# Patient Record
Sex: Female | Born: 2000 | Race: Black or African American | Hispanic: No | Marital: Single | Smoking: Never smoker
Health system: Southern US, Community
[De-identification: ages and names within clinical notes are randomized; demographics above are authoritative.]

## PROBLEM LIST (undated history)

## (undated) DIAGNOSIS — E669 Obesity, unspecified: Secondary | ICD-10-CM

## (undated) DIAGNOSIS — N926 Irregular menstruation, unspecified: Secondary | ICD-10-CM

## (undated) DIAGNOSIS — I10 Essential (primary) hypertension: Secondary | ICD-10-CM

---
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
## 2021-02-28 ENCOUNTER — Ambulatory Visit (INDEPENDENT_AMBULATORY_CARE_PROVIDER_SITE_OTHER): Payer: Medicaid Other | Admitting: Primary Care

## 2022-09-17 IMAGING — CT CT ABD-PELV W/ CM
2 of 5 series · 16 of 46 positions shown, 18 images · IV contrast (APPLIED)
Comparison: None.

CLINICAL DATA: Chest and abdominal pain.

EXAM:
CT ANGIOGRAPHY CHEST
CT ABDOMEN AND PELVIS WITH CONTRAST
TECHNIQUE: Multidetector CT imaging of the chest was performed using the
standard protocol during bolus administration of intravenous
contrast. Multiplanar CT image reconstructions and MIPs were
obtained to evaluate the vascular anatomy. Multidetector CT imaging
of the abdomen and pelvis was performed using the standard protocol
during bolus administration of intravenous contrast.
CONTRAST:  100mL OMNIPAQUE IOHEXOL 350 MG/ML SOLN

[Series 7: abdomen 5.0 · axial · 0.98mm/px · z∈[+724,+1179]mm · 13 of 105 slices shown, 15 images]
[im 7/105  soft-tissue]
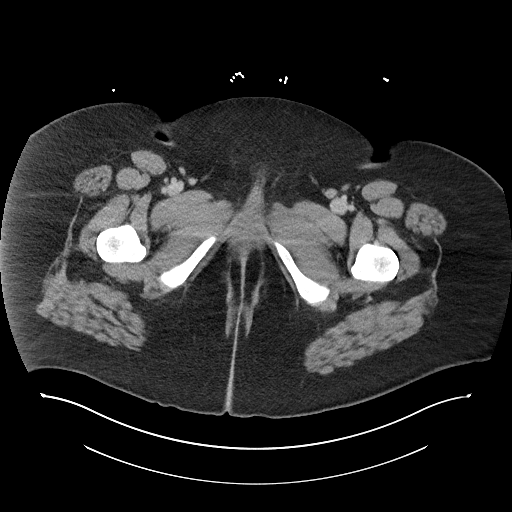
[im 7/105  bone]
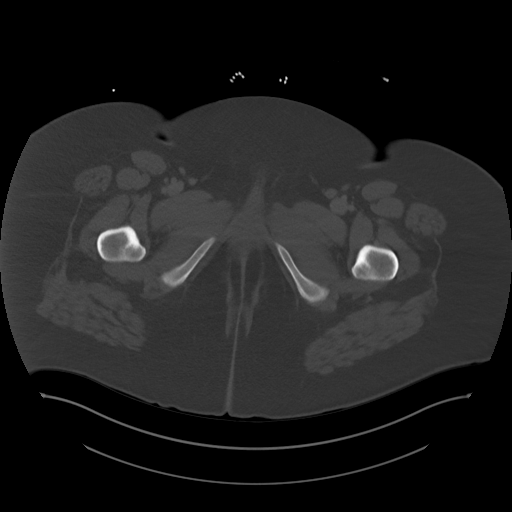
[im 14/105  soft-tissue]
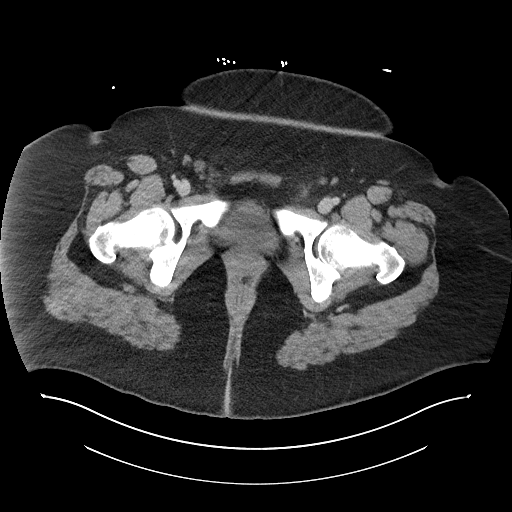
[im 21/105  soft-tissue]
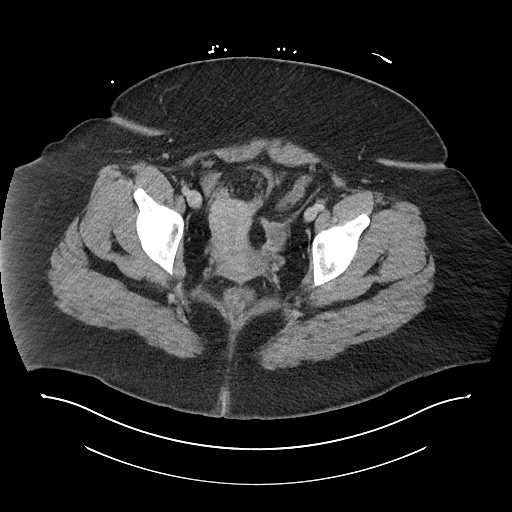
[im 28/105  soft-tissue]
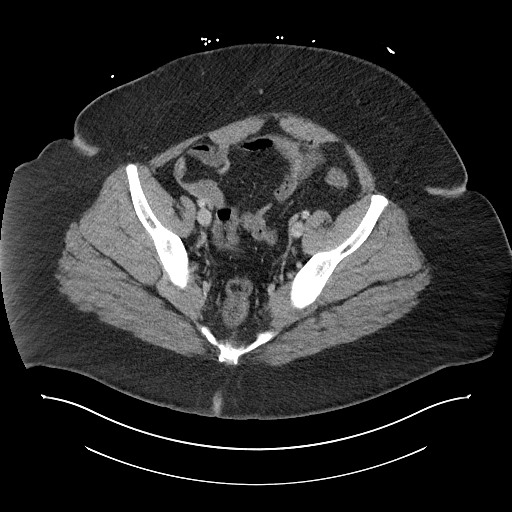
[im 35/105  soft-tissue]
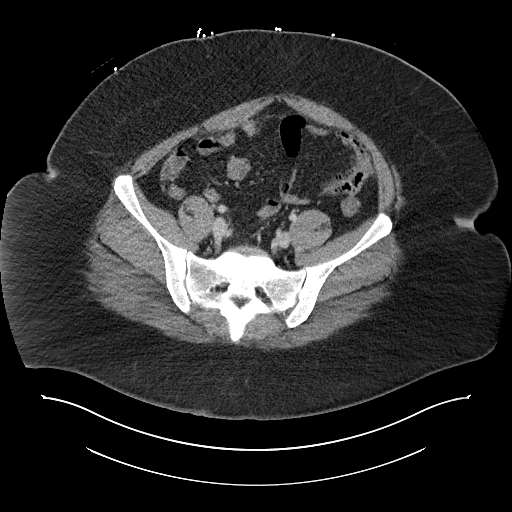
[im 42/105  soft-tissue]
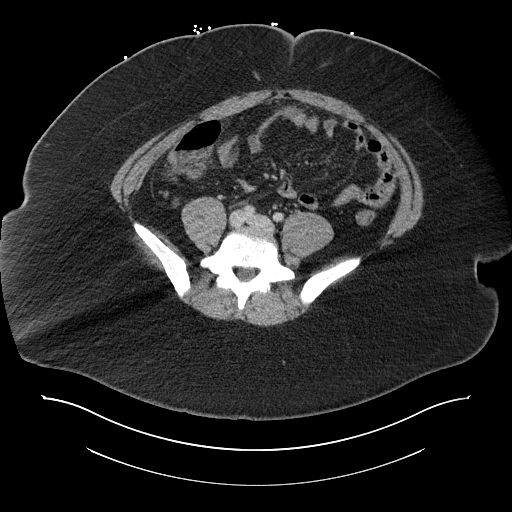
[im 56/105  soft-tissue]
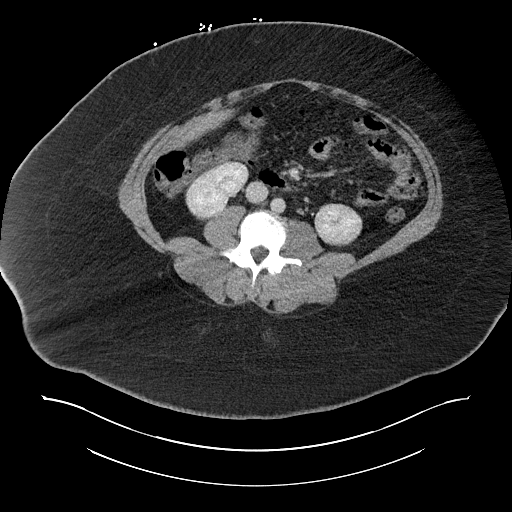
[im 63/105  soft-tissue]
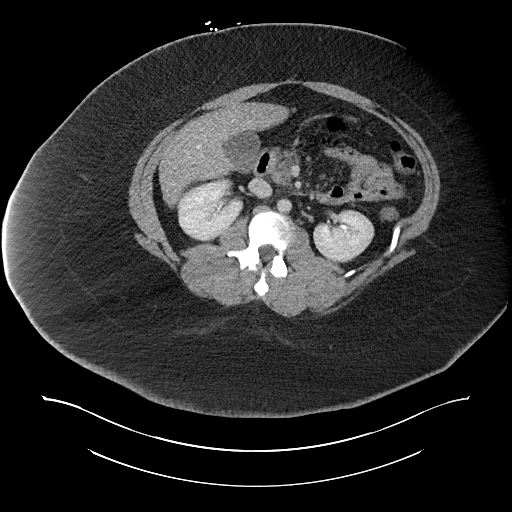
[im 70/105  soft-tissue]
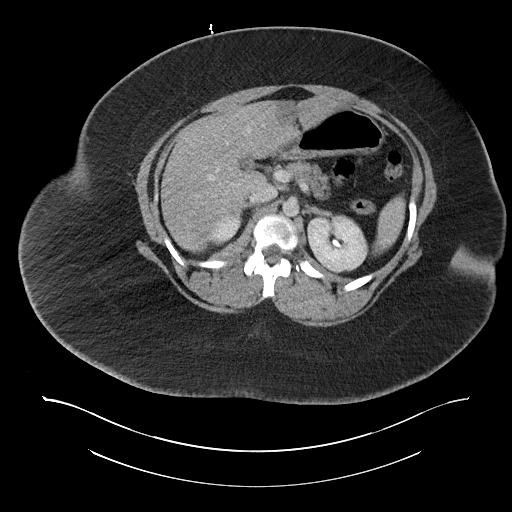
[im 70/105  bone]
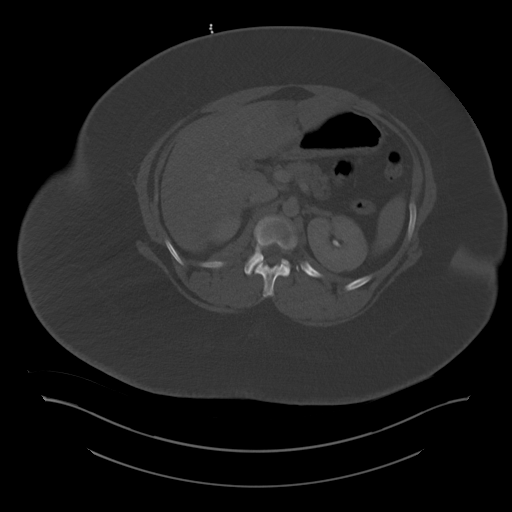
[im 77/105  soft-tissue]
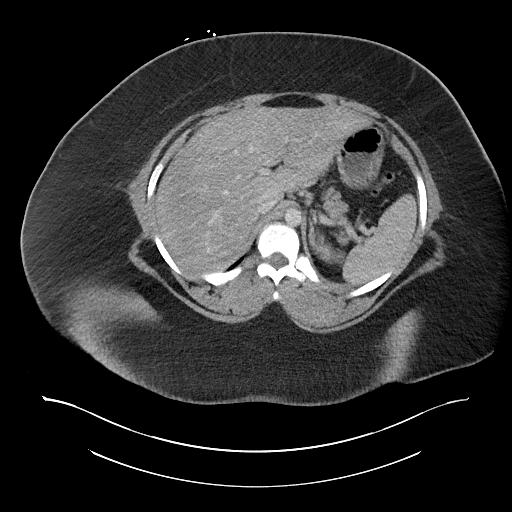
[im 84/105  soft-tissue]
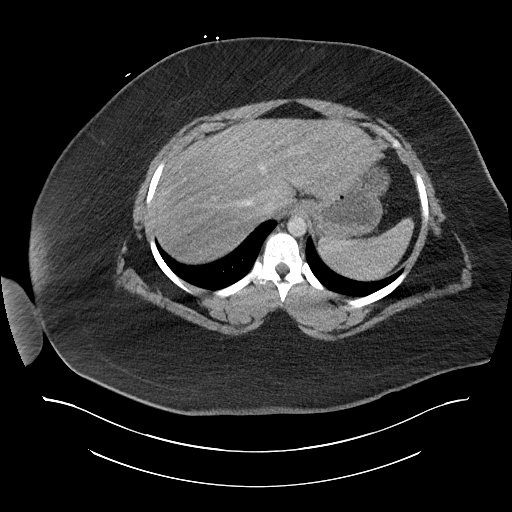
[im 91/105  soft-tissue]
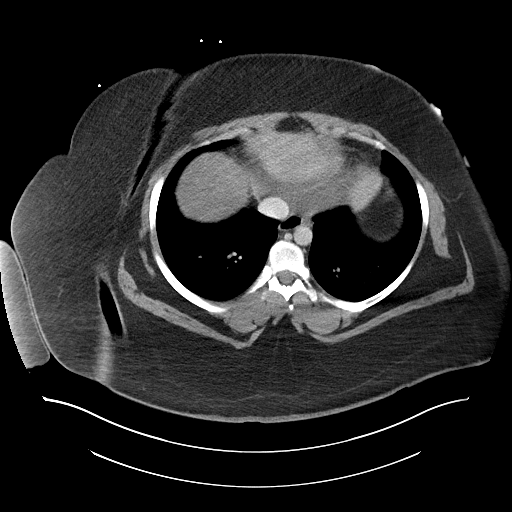
[im 98/105  soft-tissue]
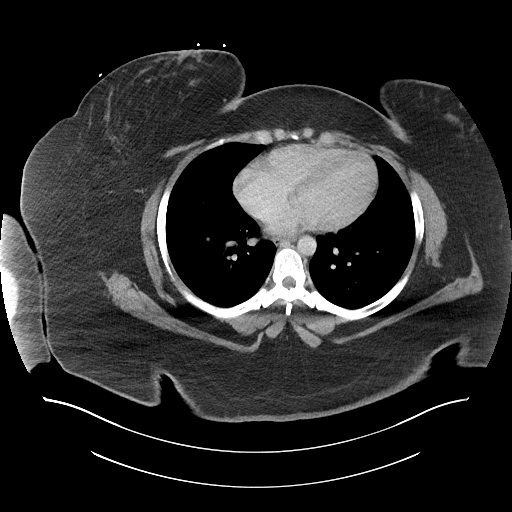

[Series 10: abdomen 3.0 mpr cor · coronal · 1.04mm/px · 3 of 133 slices shown]
[im 45/133  soft-tissue]
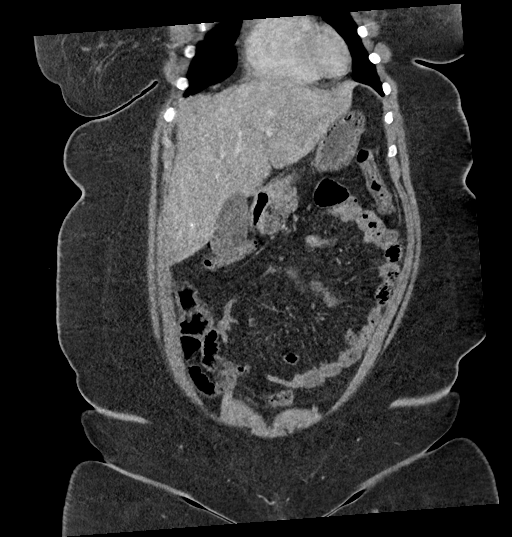
[im 59/133  soft-tissue]
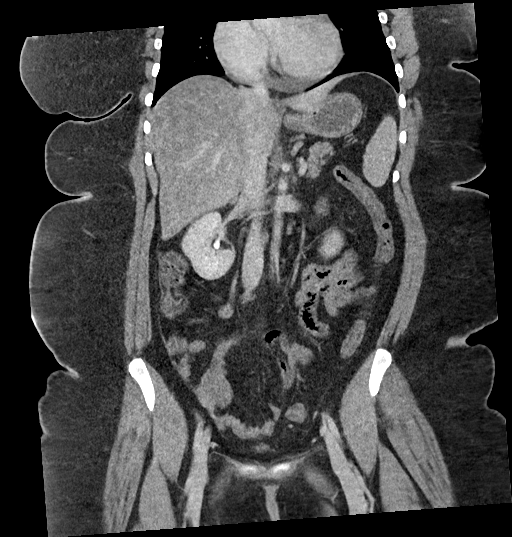
[im 74/133  soft-tissue]
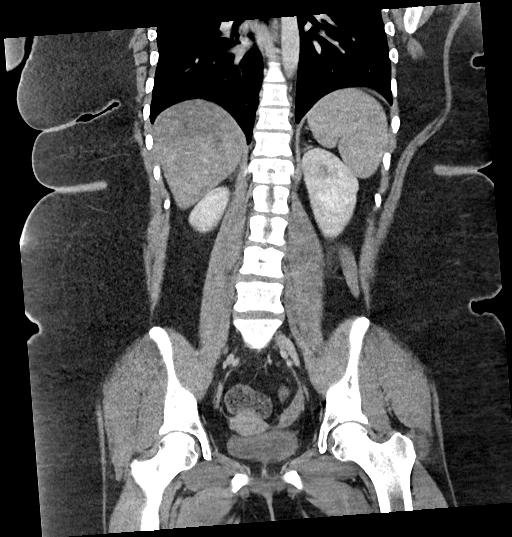

[16 of 46 positions shown; findings below may reference images not displayed]

FINDINGS: CTA CHEST FINDINGS

Cardiovascular: Satisfactory opacification of the pulmonary arteries
to the segmental level. No evidence of pulmonary embolism. Normal
heart size. No pericardial effusion.

Mediastinum/Nodes: No enlarged mediastinal, hilar, or axillary lymph
nodes. Thyroid gland, trachea, and esophagus demonstrate no
significant findings.

Lungs/Pleura: Lungs are clear. No pleural effusion or pneumothorax.

Musculoskeletal: No chest wall abnormality. No acute or significant
osseous findings.

Review of the MIP images confirms the above findings.

CT ABDOMEN and PELVIS FINDINGS

Hepatobiliary: No focal liver abnormality is seen. No gallstones,
gallbladder wall thickening, or biliary dilatation.

Pancreas: Unremarkable. No pancreatic ductal dilatation or
surrounding inflammatory changes.

Spleen: Normal in size without focal abnormality.

Adrenals/Urinary Tract: Adrenal glands are unremarkable. Kidneys are
normal, without renal calculi, focal lesion, or hydronephrosis.
Bladder is unremarkable.

Stomach/Bowel: Stomach is within normal limits. Appendix appears
normal. No evidence of bowel wall thickening, distention, or
inflammatory changes.

Vascular/Lymphatic: No significant vascular findings are present. No
enlarged abdominal or pelvic lymph nodes.

Reproductive: Uterus and bilateral adnexa are unremarkable.

Other: No abdominal wall hernia or abnormality. No abdominopelvic
ascites.

Musculoskeletal: No acute or significant osseous findings.

Review of the MIP images confirms the above findings.
IMPRESSION: No definite evidence of pulmonary embolus. No acute abnormality seen
in the chest, abdomen or pelvis.

## 2022-09-17 IMAGING — CT CT ANGIO CHEST
2 of 7 series · 17 of 46 positions shown · IV contrast (APPLIED)
Comparison: None.

CLINICAL DATA: Chest and abdominal pain.

EXAM:
CT ANGIOGRAPHY CHEST
CT ABDOMEN AND PELVIS WITH CONTRAST
TECHNIQUE: Multidetector CT imaging of the chest was performed using the
standard protocol during bolus administration of intravenous
contrast. Multiplanar CT image reconstructions and MIPs were
obtained to evaluate the vascular anatomy. Multidetector CT imaging
of the abdomen and pelvis was performed using the standard protocol
during bolus administration of intravenous contrast.
CONTRAST:  100mL OMNIPAQUE IOHEXOL 350 MG/ML SOLN

[Series 4: thins · axial · 0.77mm/px · z∈[+1119,+1321]mm · 14 of 327 slices shown]
[im 19/327  lung]
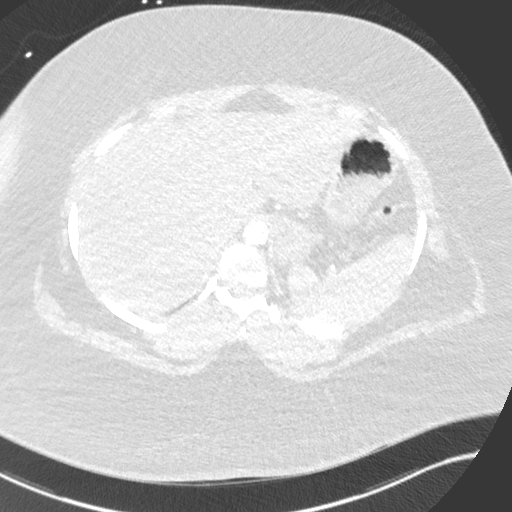
[im 37/327  soft-tissue]
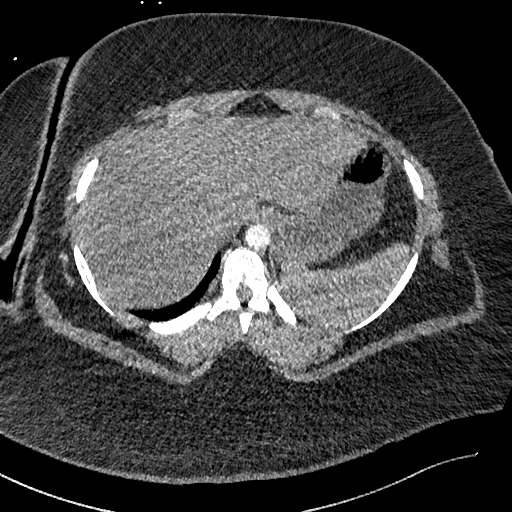
[im 73/327  lung]
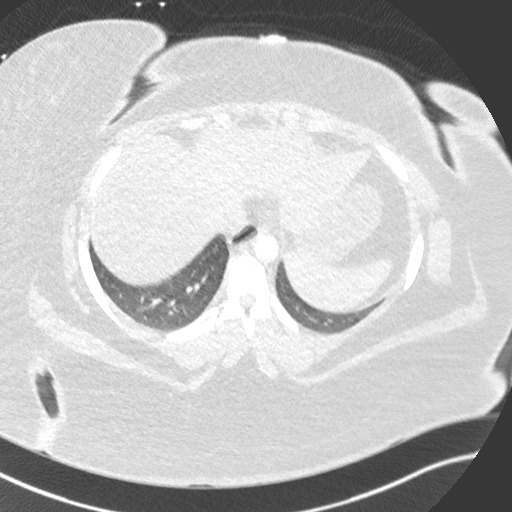
[im 91/327  soft-tissue]
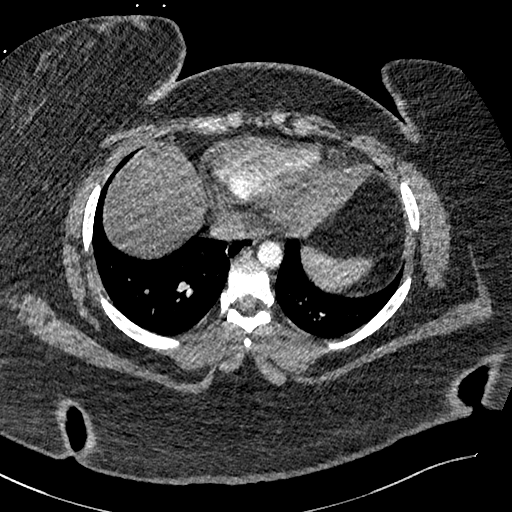
[im 109/327  lung]
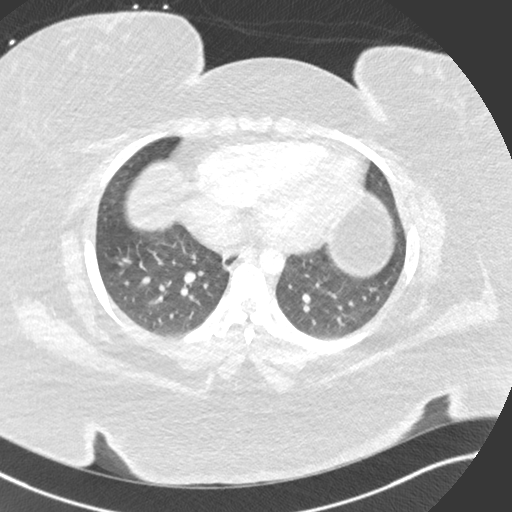
[im 127/327  soft-tissue]
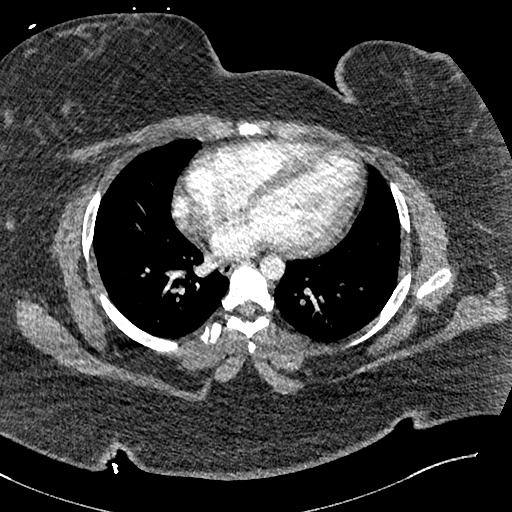
[im 145/327  lung]
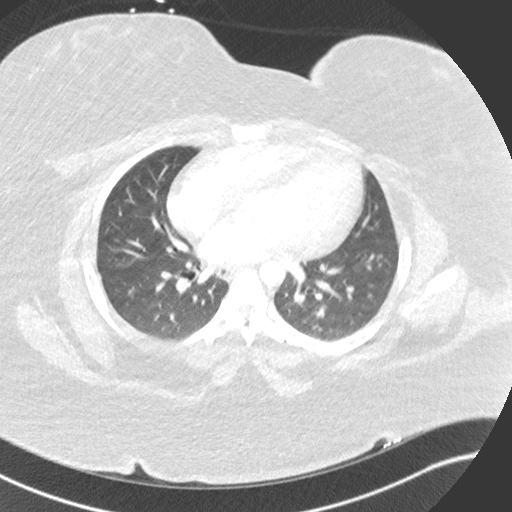
[im 182/327  soft-tissue]
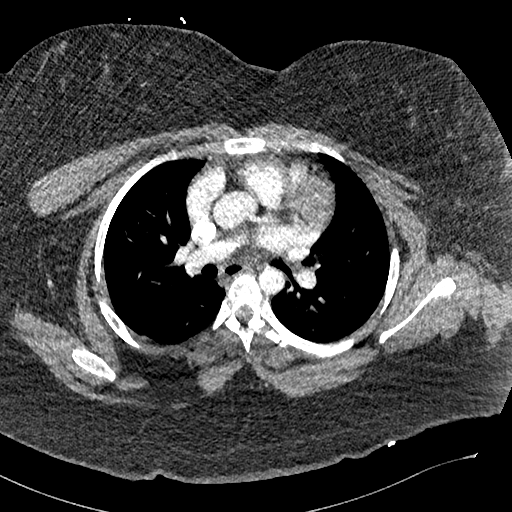
[im 200/327  lung]
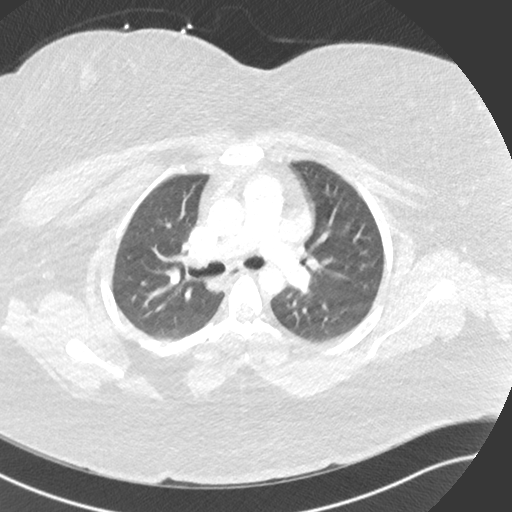
[im 218/327  soft-tissue]
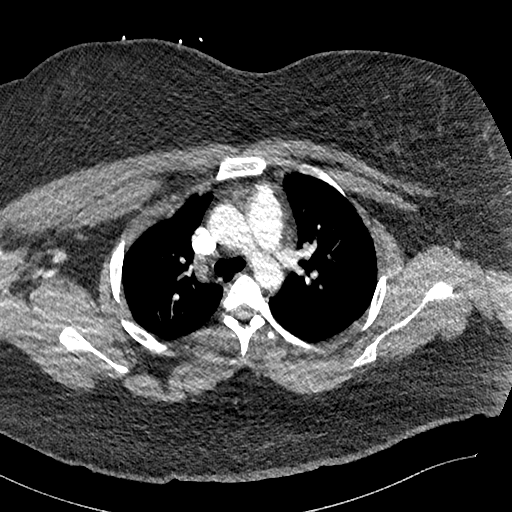
[im 236/327  lung]
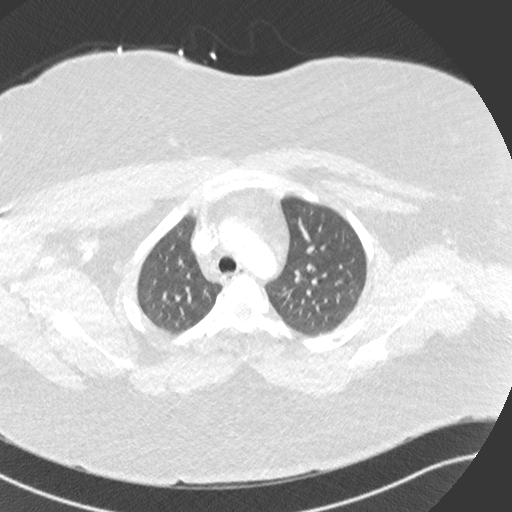
[im 254/327  soft-tissue]
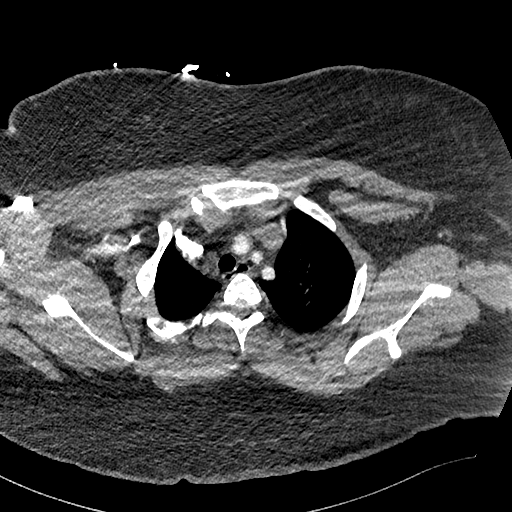
[im 290/327  lung]
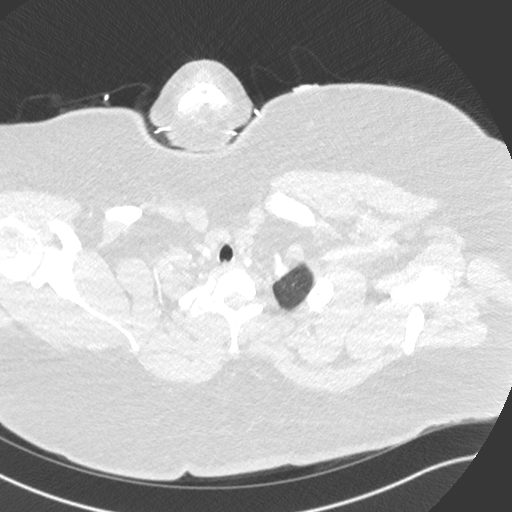
[im 308/327  soft-tissue]
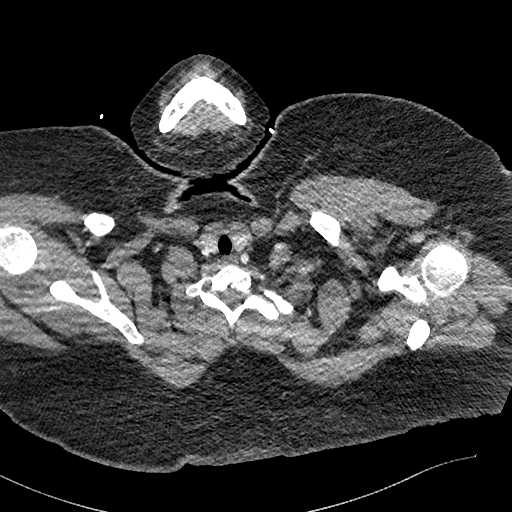

[Series 6: cor · coronal · 0.64mm/px · 3 of 187 slices shown]
[im 47/187  soft-tissue]
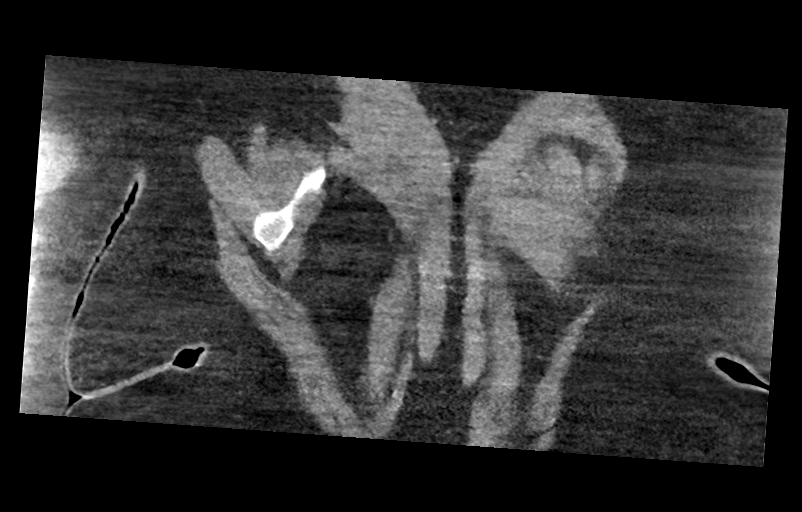
[im 94/187  soft-tissue]
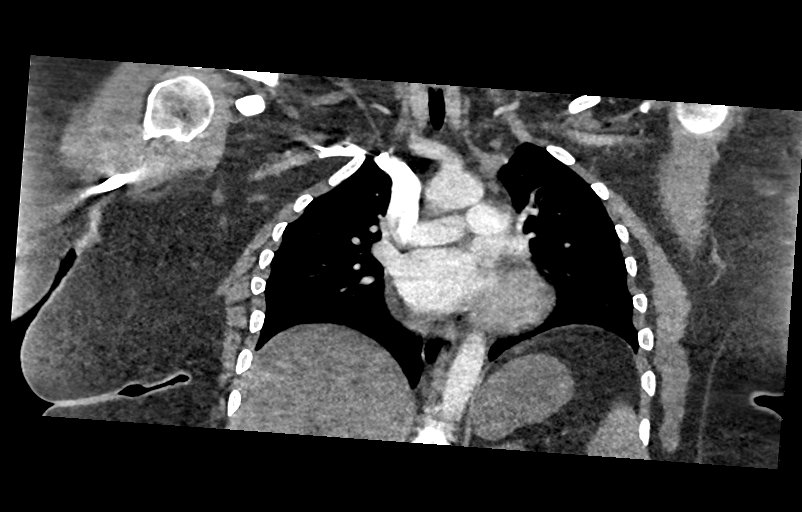
[im 140/187  soft-tissue]
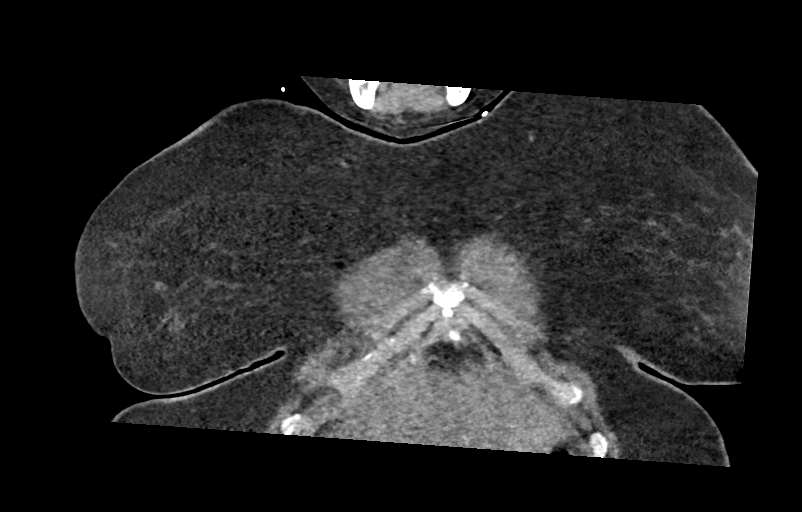

[17 of 46 positions shown; findings below may reference images not displayed]

FINDINGS: CTA CHEST FINDINGS

Cardiovascular: Satisfactory opacification of the pulmonary arteries
to the segmental level. No evidence of pulmonary embolism. Normal
heart size. No pericardial effusion.

Mediastinum/Nodes: No enlarged mediastinal, hilar, or axillary lymph
nodes. Thyroid gland, trachea, and esophagus demonstrate no
significant findings.

Lungs/Pleura: Lungs are clear. No pleural effusion or pneumothorax.

Musculoskeletal: No chest wall abnormality. No acute or significant
osseous findings.

Review of the MIP images confirms the above findings.

CT ABDOMEN and PELVIS FINDINGS

Hepatobiliary: No focal liver abnormality is seen. No gallstones,
gallbladder wall thickening, or biliary dilatation.

Pancreas: Unremarkable. No pancreatic ductal dilatation or
surrounding inflammatory changes.

Spleen: Normal in size without focal abnormality.

Adrenals/Urinary Tract: Adrenal glands are unremarkable. Kidneys are
normal, without renal calculi, focal lesion, or hydronephrosis.
Bladder is unremarkable.

Stomach/Bowel: Stomach is within normal limits. Appendix appears
normal. No evidence of bowel wall thickening, distention, or
inflammatory changes.

Vascular/Lymphatic: No significant vascular findings are present. No
enlarged abdominal or pelvic lymph nodes.

Reproductive: Uterus and bilateral adnexa are unremarkable.

Other: No abdominal wall hernia or abnormality. No abdominopelvic
ascites.

Musculoskeletal: No acute or significant osseous findings.

Review of the MIP images confirms the above findings.
IMPRESSION: No definite evidence of pulmonary embolus. No acute abnormality seen
in the chest, abdomen or pelvis.
